# Patient Record
Sex: Male | Born: 1980 | Race: Black or African American | Hispanic: No | Marital: Single | Smoking: Current every day smoker
Health system: Southern US, Community
[De-identification: ages and names within clinical notes are randomized; demographics above are authoritative.]

## PROBLEM LIST (undated history)

## (undated) DIAGNOSIS — K4090 Unilateral inguinal hernia, without obstruction or gangrene, not specified as recurrent: Secondary | ICD-10-CM

## (undated) DIAGNOSIS — K635 Polyp of colon: Secondary | ICD-10-CM

---

## 1999-12-24 ENCOUNTER — Emergency Department (HOSPITAL_COMMUNITY): Admission: EM | Admit: 1999-12-24 | Discharge: 1999-12-24 | Payer: Self-pay | Admitting: Emergency Medicine

## 1999-12-25 ENCOUNTER — Emergency Department (HOSPITAL_COMMUNITY): Admission: EM | Admit: 1999-12-25 | Discharge: 1999-12-25 | Payer: Self-pay | Admitting: Internal Medicine

## 1999-12-29 ENCOUNTER — Encounter: Payer: Self-pay | Admitting: Emergency Medicine

## 1999-12-29 ENCOUNTER — Emergency Department (HOSPITAL_COMMUNITY): Admission: EM | Admit: 1999-12-29 | Discharge: 1999-12-29 | Payer: Self-pay | Admitting: *Deleted

## 1999-12-30 ENCOUNTER — Encounter: Payer: Self-pay | Admitting: Urology

## 1999-12-30 ENCOUNTER — Ambulatory Visit (HOSPITAL_COMMUNITY): Admission: RE | Admit: 1999-12-30 | Discharge: 1999-12-30 | Payer: Self-pay | Admitting: Urology

## 2004-08-17 ENCOUNTER — Emergency Department (HOSPITAL_COMMUNITY): Admission: EM | Admit: 2004-08-17 | Discharge: 2004-08-17 | Payer: Self-pay | Admitting: Family Medicine

## 2005-03-18 ENCOUNTER — Encounter: Admission: RE | Admit: 2005-03-18 | Discharge: 2005-03-18 | Payer: Self-pay | Admitting: Sports Medicine

## 2005-04-02 ENCOUNTER — Encounter: Admission: RE | Admit: 2005-04-02 | Discharge: 2005-04-02 | Payer: Self-pay | Admitting: Sports Medicine

## 2005-06-11 ENCOUNTER — Emergency Department (HOSPITAL_COMMUNITY): Admission: EM | Admit: 2005-06-11 | Discharge: 2005-06-12 | Payer: Self-pay | Admitting: Emergency Medicine

## 2006-09-14 ENCOUNTER — Emergency Department (HOSPITAL_COMMUNITY): Admission: EM | Admit: 2006-09-14 | Discharge: 2006-09-14 | Payer: Self-pay | Admitting: Emergency Medicine

## 2008-01-09 ENCOUNTER — Emergency Department (HOSPITAL_COMMUNITY): Admission: EM | Admit: 2008-01-09 | Discharge: 2008-01-09 | Payer: Self-pay | Admitting: Emergency Medicine

## 2008-12-12 ENCOUNTER — Emergency Department (HOSPITAL_COMMUNITY): Admission: EM | Admit: 2008-12-12 | Discharge: 2008-12-12 | Payer: Self-pay | Admitting: Emergency Medicine

## 2009-02-01 ENCOUNTER — Emergency Department (HOSPITAL_COMMUNITY): Admission: EM | Admit: 2009-02-01 | Discharge: 2009-02-01 | Payer: Self-pay | Admitting: Emergency Medicine

## 2009-04-03 ENCOUNTER — Emergency Department (HOSPITAL_COMMUNITY): Admission: EM | Admit: 2009-04-03 | Discharge: 2009-04-03 | Payer: Self-pay | Admitting: Emergency Medicine

## 2010-02-08 ENCOUNTER — Encounter: Payer: Self-pay | Admitting: Sports Medicine

## 2010-04-05 LAB — COMPREHENSIVE METABOLIC PANEL
ALT: 43 U/L (ref 0–53)
Albumin: 4.3 g/dL (ref 3.5–5.2)
BUN: 8 mg/dL (ref 6–23)
CO2: 27 mEq/L (ref 19–32)
Calcium: 9.7 mg/dL (ref 8.4–10.5)
Creatinine, Ser: 0.83 mg/dL (ref 0.4–1.5)
GFR calc non Af Amer: >60 mL/min (ref >60)
Glucose, Bld: 121 mg/dL — ABNORMAL HIGH (ref 70–99)
Sodium: 135 mEq/L (ref 135–145)
Total Bilirubin: 0.7 mg/dL (ref 0.3–1.2)

## 2010-10-22 LAB — URINALYSIS, ROUTINE W REFLEX MICROSCOPIC
Glucose, UA: NEGATIVE mg/dL
Leukocytes, UA: NEGATIVE
pH: 8 (ref 5.0–8.0)

## 2010-10-22 LAB — URINE CULTURE
Colony Count: NO GROWTH
Culture: NO GROWTH

## 2010-10-22 LAB — GLUCOSE, CAPILLARY: Glucose-Capillary: 125 mg/dL — ABNORMAL HIGH (ref 70–99)

## 2010-10-30 LAB — URINALYSIS, ROUTINE W REFLEX MICROSCOPIC
Glucose, UA: NEGATIVE
Nitrite: NEGATIVE
Protein, ur: NEGATIVE
pH: 6

## 2010-11-10 ENCOUNTER — Emergency Department (HOSPITAL_COMMUNITY)
Admission: EM | Admit: 2010-11-10 | Discharge: 2010-11-10 | Discharge status: Against Medical Advice | Payer: PRIVATE HEALTH INSURANCE | Attending: Emergency Medicine | Admitting: Emergency Medicine

## 2010-11-10 DIAGNOSIS — Z0389 Encounter for observation for other suspected diseases and conditions ruled out: Secondary | ICD-10-CM | POA: Insufficient documentation

## 2011-07-26 ENCOUNTER — Encounter (HOSPITAL_COMMUNITY): Payer: Self-pay | Admitting: Emergency Medicine

## 2011-07-26 ENCOUNTER — Emergency Department (HOSPITAL_COMMUNITY)
Admission: EM | Admit: 2011-07-26 | Discharge: 2011-07-26 | Disposition: A | Discharge status: Home / Self Care | Payer: Self-pay | Attending: Emergency Medicine | Admitting: Emergency Medicine

## 2011-07-26 DIAGNOSIS — R109 Unspecified abdominal pain: Secondary | ICD-10-CM

## 2011-07-26 DIAGNOSIS — R1013 Epigastric pain: Secondary | ICD-10-CM | POA: Insufficient documentation

## 2011-07-26 DIAGNOSIS — R112 Nausea with vomiting, unspecified: Secondary | ICD-10-CM | POA: Insufficient documentation

## 2011-07-26 HISTORY — DX: Polyp of colon: K63.5

## 2011-07-26 LAB — CBC
HCT: 43.6 % (ref 39.0–52.0)
Hemoglobin: 16 g/dL (ref 13.0–17.0)
MCH: 30.7 pg (ref 26.0–34.0)
MCHC: 36.7 g/dL — ABNORMAL HIGH (ref 30.0–36.0)
MCV: 83.5 fL (ref 78.0–100.0)
Platelets: 220 10*3/uL (ref 150–400)
RBC: 5.22 MIL/uL (ref 4.22–5.81)
RDW: 12.6 % (ref 11.5–15.5)
WBC: 11.7 10*3/uL — ABNORMAL HIGH (ref 4.0–10.5)

## 2011-07-26 LAB — COMPREHENSIVE METABOLIC PANEL
ALT: 55 U/L — ABNORMAL HIGH (ref 0–53)
AST: 38 U/L — ABNORMAL HIGH (ref 0–37)
Albumin: 4.1 g/dL (ref 3.5–5.2)
Alkaline Phosphatase: 63 U/L (ref 39–117)
BUN: 9 mg/dL (ref 6–23)
CO2: 23 mEq/L (ref 19–32)
Calcium: 9.9 mg/dL (ref 8.4–10.5)
Chloride: 98 mEq/L (ref 96–112)
Creatinine, Ser: 0.77 mg/dL (ref 0.50–1.35)
GFR calc Af Amer: >90 mL/min (ref >90)
GFR calc non Af Amer: >90 mL/min (ref >90)
Glucose, Bld: 126 mg/dL — ABNORMAL HIGH (ref 70–99)
Potassium: 3.7 mEq/L (ref 3.5–5.1)
Sodium: 133 mEq/L — ABNORMAL LOW (ref 135–145)
Total Bilirubin: 0.6 mg/dL (ref 0.3–1.2)
Total Protein: 7.8 g/dL (ref 6.0–8.3)

## 2011-07-26 LAB — URINALYSIS, ROUTINE W REFLEX MICROSCOPIC
Bilirubin Urine: NEGATIVE
Glucose, UA: NEGATIVE mg/dL
Hgb urine dipstick: NEGATIVE
Ketones, ur: NEGATIVE mg/dL
Leukocytes, UA: NEGATIVE
Nitrite: NEGATIVE
Protein, ur: 30 mg/dL — AB
Specific Gravity, Urine: 1.024 (ref 1.005–1.030)
Urobilinogen, UA: 0.2 mg/dL (ref 0.0–1.0)
pH: 8 (ref 5.0–8.0)

## 2011-07-26 LAB — URINE MICROSCOPIC-ADD ON

## 2011-07-26 LAB — LIPASE, BLOOD: Lipase: 19 U/L (ref 11–59)

## 2011-07-26 MED ORDER — ONDANSETRON HCL 4 MG/2ML IJ SOLN
4.0000 mg | Freq: Once | INTRAMUSCULAR | Status: AC
Start: 2011-07-26 — End: 2011-07-26
  Administered 2011-07-26: 4 mg via INTRAVENOUS
  Filled 2011-07-26: qty 2

## 2011-07-26 MED ORDER — SODIUM CHLORIDE 0.9 % IV BOLUS (SEPSIS)
1000.0000 mL | Freq: Once | INTRAVENOUS | Status: AC
  Administered 2011-07-26: 1000 mL via INTRAVENOUS

## 2011-07-26 MED ORDER — GI COCKTAIL ~~LOC~~
30.0000 mL | Freq: Once | ORAL | Status: AC
  Administered 2011-07-26: 30 mL via ORAL
  Filled 2011-07-26: qty 30

## 2011-07-26 MED ORDER — FAMOTIDINE 20 MG PO TABS: 20.0000 mg | ORAL_TABLET | Freq: Two times a day (BID) | ORAL | Status: AC

## 2011-07-26 MED ORDER — HYDROMORPHONE HCL PF 1 MG/ML IJ SOLN
1.0000 mg | Freq: Once | INTRAMUSCULAR | Status: AC
  Administered 2011-07-26: 1 mg via INTRAVENOUS
  Filled 2011-07-26: qty 1

## 2011-07-26 MED ORDER — LORAZEPAM 2 MG/ML IJ SOLN
1.5000 mg | Freq: Once | INTRAMUSCULAR | Status: AC
  Administered 2011-07-26: 1.5 mg via INTRAVENOUS
  Filled 2011-07-26: qty 1

## 2011-07-26 NOTE — ED Notes (Signed)
Pt c/o abd pain and vomiting since midnight. Pt reports "polyps on gallbladder".  States "they normally give me this pink stuff to drink and it makes all of the pain go away."

## 2011-07-26 NOTE — ED Notes (Signed)
md at bedside

## 2011-07-26 NOTE — ED Provider Notes (Signed)
History    31yM with abdominal pain. Onset around 0000 this morning. Epigastric. Gradual onset. Constant. No appreciable exacerbating or relieving factors. Just hurts. Does not radiate. Nausea and vomiting x2. NBNB. No fever or chills. No urinary complaints. Denies hx of abdominal surgery. Has had pain multiple times previous, but not in over 6 months. Previous evaluation including [place] which showed adenomyomatosis otherwise normal. Reports that he typically receives "that pink stuff to drink and the pain goes away."  CSN: [id number]  Arrival date & time 07/26/11  3536   First MD Initiated Contact with Patient 07/26/11 [phone]      Chief Complaint  Patient presents with  . Abdominal Pain  . Emesis    (Consider location/radiation/quality/duration/timing/severity/associated sxs/prior treatment) HPI  Past Medical History  Diagnosis Date  . Intestinal polyps     reports on gallbladder    History reviewed. No pertinent past surgical history.  History reviewed. No pertinent family history.  History  Substance Use Topics  . Smoking status: Not on file  . Smokeless tobacco: Not on file  . Alcohol Use: Yes     occasionally      Review of Systems   Review of symptoms negative unless otherwise noted in HPI.   Allergies  Penicillins  Home Medications   Current Outpatient Rx  Name Route Sig Dispense Refill  . HYDROCODONE-ACETAMINOPHEN 5-500 MG PO TABS Oral Take 0.5 tablets by mouth every 6 (six) hours as needed. For pain    . OXYCODONE-ACETAMINOPHEN 5-325 MG PO TABS Oral Take 0.5 tablets by mouth every 4 (four) hours as needed. For pain    . SIMETHICONE 125 MG PO CAPS Oral Take 2 capsules by mouth every 6 (six) hours as needed. For bloating      BP 123/63  Pulse 85  Temp 97.4 F (36.3 C) (Oral)  Resp 24  SpO2 100%  Physical Exam  Nursing note and vitals reviewed. Constitutional: He appears well-developed and well-nourished. No distress.  HENT:  Head: Normocephalic and  atraumatic.  Eyes: Conjunctivae are normal. Right eye exhibits no discharge. Left eye exhibits no discharge.  Neck: Neck supple.  Cardiovascular: Normal rate, regular rhythm and normal heart sounds.  Exam reveals no gallop and no friction rub.   No murmur heard. Pulmonary/Chest: Effort normal and breath sounds normal. No respiratory distress.  Abdominal: Soft. He exhibits no distension. There is tenderness. There is no rebound and no guarding.       Tenderness in epigastrium without rebound or guarding.  Genitourinary:       No cva tenderness  Musculoskeletal: He exhibits no edema and no tenderness.  Neurological: He is alert.  Skin: Skin is warm and dry.  Psychiatric: He has a normal mood and affect. His behavior is normal. Thought content normal.    ED Course  Procedures (including critical care time)  Labs Reviewed  URINALYSIS, ROUTINE W REFLEX MICROSCOPIC - Abnormal; Notable for the following:    APPearance CLOUDY (*)     Protein, ur 30 (*)     All other components within normal limits  COMPREHENSIVE METABOLIC PANEL - Abnormal; Notable for the following:    Sodium 133 (*)     Glucose, Bld 126 (*)     AST 38 (*)     ALT 55 (*)     All other components within normal limits  CBC - Abnormal; Notable for the following:    WBC 11.7 (*)     MCHC 36.7 (*)  All other components within normal limits  URINE MICROSCOPIC-ADD ON - Abnormal; Notable for the following:    Bacteria, UA MANY (*)     All other components within normal limits  LIPASE, BLOOD   No results found.   1. Abdominal pain       MDM  31yM with abdominal pain. Mild tenderness on exam. Low suspicion for acute surgical abdomen and imaging deferred. Afebrile and well appearing. Pain much improved with meds. Repeat exam prior to DC reassuring.  Return precautions discussed. Plan trial of h2 blocker for possible. PUD. GI fu.        Raeford Razor, MD 07/26/11 1039

## 2011-07-26 NOTE — ED Notes (Signed)
Pt alert and oriented x4. Respirations even and unlabored, bilateral symmetrical rise and fall of chest. Skin warm and dry. In no acute distress. Denies needs.   

## 2011-07-26 NOTE — ED Notes (Signed)
Pt alert and oriented x4. Respirations even and unlabored, bilateral symmetrical rise and fall of chest. Skin warm and dry. In no acute distress. Denies needs.   Pt offered a wheelchair to discharge window. Pt denied.

## 2018-02-26 ENCOUNTER — Other Ambulatory Visit: Payer: Self-pay

## 2018-02-26 ENCOUNTER — Encounter (HOSPITAL_COMMUNITY): Payer: Self-pay

## 2018-02-26 ENCOUNTER — Emergency Department (HOSPITAL_COMMUNITY)
Admission: EM | Admit: 2018-02-26 | Discharge: 2018-02-26 | Disposition: A | Discharge status: Home / Self Care | Payer: PRIVATE HEALTH INSURANCE | Attending: Emergency Medicine | Admitting: Emergency Medicine

## 2018-02-26 ENCOUNTER — Emergency Department (HOSPITAL_COMMUNITY): Payer: PRIVATE HEALTH INSURANCE

## 2018-02-26 DIAGNOSIS — J101 Influenza due to other identified influenza virus with other respiratory manifestations: Secondary | ICD-10-CM | POA: Insufficient documentation

## 2018-02-26 LAB — BASIC METABOLIC PANEL
ANION GAP: 12 (ref 5–15)
BUN: 7 mg/dL (ref 6–20)
CHLORIDE: 99 mmol/L (ref 98–111)
CO2: 24 mmol/L (ref 22–32)
Calcium: 9.1 mg/dL (ref 8.9–10.3)
Creatinine, Ser: 0.89 mg/dL (ref 0.61–1.24)
GFR calc Af Amer: >60 mL/min (ref >60)
GFR calc non Af Amer: >60 mL/min (ref >60)
GLUCOSE: 104 mg/dL — AB (ref 70–99)
POTASSIUM: 3.6 mmol/L (ref 3.5–5.1)
Sodium: 135 mmol/L (ref 135–145)

## 2018-02-26 LAB — CBC WITH DIFFERENTIAL/PLATELET
ABS IMMATURE GRANULOCYTES: 0.02 10*3/uL (ref 0.00–0.07)
BASOS PCT: 0 %
Basophils Absolute: 0 10*3/uL (ref 0.0–0.1)
EOS ABS: 0 10*3/uL (ref 0.0–0.5)
Eosinophils Relative: 0 %
HCT: 46.2 % (ref 39.0–52.0)
Hemoglobin: 15.3 g/dL (ref 13.0–17.0)
IMMATURE GRANULOCYTES: 0 %
LYMPHS ABS: 0.5 10*3/uL — AB (ref 0.7–4.0)
Lymphocytes Relative: 8 %
MCH: 29.1 pg (ref 26.0–34.0)
MCHC: 33.1 g/dL (ref 30.0–36.0)
MCV: 87.8 fL (ref 80.0–100.0)
MONO ABS: 0.6 10*3/uL (ref 0.1–1.0)
MONOS PCT: 10 %
NEUTROS ABS: 4.7 10*3/uL (ref 1.7–7.7)
NEUTROS PCT: 82 %
NRBC: 0 % (ref 0.0–0.2)
Platelets: 172 10*3/uL (ref 150–400)
RBC: 5.26 MIL/uL (ref 4.22–5.81)
RDW: 12.6 % (ref 11.5–15.5)
WBC: 5.8 10*3/uL (ref 4.0–10.5)

## 2018-02-26 LAB — INFLUENZA PANEL BY PCR (TYPE A & B)
INFLAPCR: POSITIVE — AB
INFLBPCR: NEGATIVE

## 2018-02-26 MED ORDER — KETOROLAC TROMETHAMINE 30 MG/ML IJ SOLN
30.0000 mg | Freq: Once | INTRAMUSCULAR | Status: AC
  Administered 2018-02-26: 30 mg via INTRAVENOUS
  Filled 2018-02-26: qty 1

## 2018-02-26 MED ORDER — ONDANSETRON HCL 4 MG/2ML IJ SOLN
4.0000 mg | Freq: Once | INTRAMUSCULAR | Status: AC
  Administered 2018-02-26: 4 mg via INTRAVENOUS
  Filled 2018-02-26: qty 2

## 2018-02-26 MED ORDER — OSELTAMIVIR PHOSPHATE 75 MG PO CAPS: 75.0000 mg | ORAL_CAPSULE | Freq: Two times a day (BID) | ORAL | 0 refills | Status: AC

## 2018-02-26 MED ORDER — IPRATROPIUM-ALBUTEROL 0.5-2.5 (3) MG/3ML IN SOLN
3.0000 mL | Freq: Once | RESPIRATORY_TRACT | Status: AC
Start: 2018-02-26 — End: 2018-02-26
  Administered 2018-02-26: 3 mL via RESPIRATORY_TRACT
  Filled 2018-02-26: qty 3

## 2018-02-26 MED ORDER — ALBUTEROL SULFATE HFA 108 (90 BASE) MCG/ACT IN AERS
2.0000 | INHALATION_SPRAY | RESPIRATORY_TRACT | Status: DC | PRN
  Administered 2018-02-26: 2 via RESPIRATORY_TRACT
  Filled 2018-02-26: qty 6.7

## 2018-02-26 MED ORDER — IBUPROFEN 800 MG PO TABS: 800.0000 mg | ORAL_TABLET | Freq: Three times a day (TID) | ORAL | 0 refills | Status: AC | PRN

## 2018-02-26 MED ORDER — ONDANSETRON 4 MG PO TBDP: 4.0000 mg | ORAL_TABLET | Freq: Three times a day (TID) | ORAL | 0 refills | Status: AC | PRN

## 2018-02-26 MED ORDER — METHYLPREDNISOLONE SODIUM SUCC 125 MG IJ SOLR
125.0000 mg | Freq: Once | INTRAMUSCULAR | Status: AC
  Administered 2018-02-26: 125 mg via INTRAVENOUS
  Filled 2018-02-26: qty 2

## 2018-02-26 MED ORDER — IPRATROPIUM-ALBUTEROL 0.5-2.5 (3) MG/3ML IN SOLN
3.0000 mL | Freq: Once | RESPIRATORY_TRACT | Status: AC
  Administered 2018-02-26: 3 mL via RESPIRATORY_TRACT
  Filled 2018-02-26: qty 3

## 2018-02-26 NOTE — ED Notes (Signed)
Reviewed d/c instructions with pt, who verbalized understanding and had no outstanding questions. Pt departed in NAD, refused use of wheelchair.   

## 2018-02-26 NOTE — ED Notes (Signed)
Pt placed on 2L [place] with oxygen saturation of 94%, 90% on room air

## 2018-02-26 NOTE — ED Notes (Signed)
Pt ambulated in hallway on pulse ox with steady gait. O2 saturation stayed between 90-95% and went down to 89% when pt got back to room. No complaints from the pt while ambulating and he did state that he was "breathing so much better than when he first came in."

## 2018-02-26 NOTE — ED Triage Notes (Addendum)
Pt reports flu like symptoms since this morning (gen body aches, cough, chills, no appetite) pt taking tylenol OTC. Pt also reports he is feeling short of breath

## 2018-02-26 NOTE — ED Provider Notes (Signed)
[facility] Provider Note   CSN: [id number] Arrival date & time: 02/26/18  1958     History   Chief Complaint Chief Complaint  Patient presents with  . Generalized Body Aches  . Chills  . Cough  . Shortness of Breath    HPI Amias D Ladona Ridgelaylor is a 38 y.o. male.  The history is provided by the patient and medical records. No language interpreter was used.  Cough  Associated symptoms: chills, myalgias and shortness of breath   Associated symptoms: no chest pain, no fever and no sore throat   Shortness of Breath  Associated symptoms: cough   Associated symptoms: no chest pain, no fever and no sore throat    Allon D Ladona Ridgelaylor is a 38 y.o. male  With no known PMH who presents to the Emergency Department complaining of shortness of breath which began today. Associated with generalized body aches, cough, chills, decreased appetite and nausea. He tried to smoke a cigarette and immediately threw up. He has had no further emesis outside of this. No diarrhea. No known fever. His child had similar symptoms a few days ago, but now feels improved. He is a smoker. Denies any known lung problems, never had to use an inhaler. Tried to take some Tylenol prior to arrival, but believes he threw it up. No other meds taken.   Past Medical History:  Diagnosis Date  . Intestinal polyps    reports on gallbladder    There are no active problems to display for this patient.   History reviewed. No pertinent surgical history.      Home Medications    Prior to Admission medications   Medication Sig Start Date End Date Taking? Authorizing Provider  famotidine (PEPCID) 20 MG tablet Take 1 tablet (20 mg total) by mouth 2 (two) times daily. 07/26/11 07/25/12  Raeford RazorKohut, Stephen, MD  HYDROcodone-acetaminophen (VICODIN) 5-500 MG per tablet Take 0.5 tablets by mouth every 6 (six) hours as needed. For pain    [provider]  ibuprofen (ADVIL,MOTRIN) 800 MG tablet Take 1  tablet (800 mg total) by mouth every 8 (eight) hours as needed. 02/26/18   Ary Rudnick, Chase PicketJaime Pilcher, PA-C  ondansetron (ZOFRAN ODT) 4 MG disintegrating tablet Take 1 tablet (4 mg total) by mouth every 8 (eight) hours as needed for nausea or vomiting. 02/26/18   Nathalee Smarr, Chase PicketJaime Pilcher, PA-C  oseltamivir (TAMIFLU) 75 MG capsule Take 1 capsule (75 mg total) by mouth every 12 (twelve) hours. 02/26/18   Biannca Scantlin, Chase PicketJaime Pilcher, PA-C  oxyCODONE-acetaminophen (PERCOCET) 5-325 MG per tablet Take 0.5 tablets by mouth every 4 (four) hours as needed. For pain    [provider]  Simethicone (GAS FREE EXTRA STRENGTH) 125 MG CAPS Take 2 capsules by mouth every 6 (six) hours as needed. For bloating    [provider]    Family History No family history on file.  Social History Social History   Tobacco Use  . Smoking status: Not on file  Substance Use Topics  . Alcohol use: Yes    Comment: occasionally  . Drug use: Not on file     Allergies   Penicillins   Review of Systems Review of Systems  Constitutional: Positive for appetite change, chills and fatigue. Negative for fever.  HENT: Positive for congestion. Negative for sore throat.   Respiratory: Positive for cough and shortness of breath.   Cardiovascular: Negative for chest pain.  Musculoskeletal: Positive for myalgias.  All other systems reviewed  and are negative.    Physical Exam Updated Vital Signs BP 115/69 (BP Location: Left Arm)   Pulse 88   Temp 99.3 F (37.4 C) (Oral)   Resp 16   SpO2 90%   Physical Exam Vitals signs and nursing note reviewed.  Constitutional:      General: He is not in acute distress.    Appearance: He is well-developed.  HENT:     Head: Normocephalic and atraumatic.  Neck:     Musculoskeletal: Neck supple.  Cardiovascular:     Heart sounds: Normal heart sounds. No murmur.     Comments: Mildly tachycardic, but regular. Pulmonary:     Effort: Pulmonary effort is normal. No respiratory distress.      Comments: Inspiratory wheezing bilaterally. Abdominal:     General: There is no distension.     Palpations: Abdomen is soft.     Tenderness: There is no abdominal tenderness.  Skin:    General: Skin is warm and dry.  Neurological:     Mental Status: He is alert and oriented to person, place, and time.      ED Treatments / Results  Labs (all labs ordered are listed, but only abnormal results are displayed) Labs Reviewed  INFLUENZA PANEL BY PCR (TYPE A & B) - Abnormal; Notable for the following components:      Result Value   Influenza A By PCR POSITIVE (*)    All other components within normal limits  CBC WITH DIFFERENTIAL/PLATELET - Abnormal; Notable for the following components:   Lymphs Abs 0.5 (*)    All other components within normal limits  BASIC METABOLIC PANEL - Abnormal; Notable for the following components:   Glucose, Bld 104 (*)    All other components within normal limits    EKG EKG Interpretation  Date/Time:  Sunday February 26 2018 20:20:17 EST Ventricular Rate:  104 PR Interval:  140 QRS Duration: 82 QT Interval:  324 QTC Calculation: 426 R Axis:   103 Text Interpretation:  Sinus tachycardia Rightward axis Borderline ECG Confirmed by Gerhard MunchLockwood, Robert [phone]([id number]) on 02/26/2018 8:48:36 PM   Radiology Dg Chest 2 View  Result Date: 02/26/2018 CLINICAL DATA:  Flu-like symptoms for 1 day, initial encounter EXAM: CHEST - 2 VIEW COMPARISON:  None. FINDINGS: The heart size and mediastinal contours are within normal limits. Both lungs are clear. The visualized skeletal structures are unremarkable. IMPRESSION: No active cardiopulmonary disease. Electronically Signed   By: Alcide CleverMark  Lukens M.D.   On: 02/26/2018 21:07    Procedures Procedures (including critical care time)  Medications Ordered in ED Medications  albuterol (PROVENTIL HFA;VENTOLIN HFA) 108 (90 Base) MCG/ACT inhaler 2 puff (2 puffs Inhalation Given 02/26/18 2251)  ipratropium-albuterol (DUONEB) 0.5-2.5 (3)  MG/3ML nebulizer solution 3 mL (3 mLs Nebulization Given 02/26/18 2024)  ketorolac (TORADOL) 30 MG/ML injection 30 mg (30 mg Intravenous Given 02/26/18 2029)  methylPREDNISolone sodium succinate (SOLU-MEDROL) 125 mg/2 mL injection 125 mg (125 mg Intravenous Given 02/26/18 2030)  ondansetron (ZOFRAN) injection 4 mg (4 mg Intravenous Given 02/26/18 2101)  ipratropium-albuterol (DUONEB) 0.5-2.5 (3) MG/3ML nebulizer solution 3 mL (3 mLs Nebulization Given 02/26/18 2150)     Initial Impression / Assessment and Plan / ED Course  I have reviewed the triage vital signs and the nursing notes.  Pertinent labs & imaging results that were available during my care of the patient were reviewed by me and considered in my medical decision making (see chart for details).    Boyd KerbsChike D Fesperman is  a 38 y.o. male who presents to ED for shortness of breath which began this morning associated with generalized body aches cough.  On initial examination, oxygenation saturation right around 89 to 90% on room air.  He was placed on 2 L.  Given neb treatment.  On reevaluation, he endorses that his symptoms are much improved.  He no longer feels short of breath.  Chest x-ray without acute findings.  He did test positive for flu A.  On another reevaluation, his lungs are clear to auscultation bilaterally.  He is speaking in full sentences without any difficulty.  He is oxygenating right around 90% still.  He ambulated and did not feel short of breath whatsoever.  He did drop to 88%, but again felt asymptomatic.  He would very much like to go home. He tells me he is breathing fine and does not see why he would need to stay in the hospital. We discussed that he should have very low threshold to return should his breathing worsen.  Will give prescription for Tamiflu along with ibuprofen and Zofran for symptom control.  He was provided with an albuterol inhaler here in the emergency department and instructed on how to use this.  Strict return  precautions again discussed and all questions answered.  Patient discharged from hospital in satisfactory condition.   Patient discussed with Dr. Jeraldine Loots who agrees with treatment plan.     Final Clinical Impressions(s) / ED Diagnoses   Final diagnoses:  Influenza A    ED Discharge Orders         Ordered    oseltamivir (TAMIFLU) 75 MG capsule  Every 12 hours     02/26/18 2257    ibuprofen (ADVIL,MOTRIN) 800 MG tablet  Every 8 hours PRN     02/26/18 2257    ondansetron (ZOFRAN ODT) 4 MG disintegrating tablet  Every 8 hours PRN     02/26/18 2257           Joshuah Minella, Chase Picket, PA-C 02/26/18 2305    Gerhard Munch, MD 02/26/18 2352

## 2018-02-26 NOTE — Discharge Instructions (Addendum)
It was my pleasure taking care of you today!  Use your inhaler (2 puffs) every 4-6 hours as needed for shortness of breath.  Zofran as needed for nausea.  Alternate between Tylenol and ibuprofen as needed for body aches / fevers. Rest, drink plenty of fluids to stay hydrated. Wash your hands often.  Follow up with your doctor in regards to your hospital visit. As we discussed, you should return to the ER if you begin having difficulty breathing. Return to the emergency department if symptoms worsen, become progressive, or become more concerning.

## 2018-02-26 NOTE — ED Notes (Signed)
Patient transported to [facility] 

## 2019-06-13 IMAGING — DX DG CHEST 2V
2 series · 2 of 2 positions shown · non-contrast
Comparison: None.

CLINICAL DATA: Flu-like symptoms for 1 day, initial encounter

EXAM:
CHEST - 2 VIEW

[w chest pa]
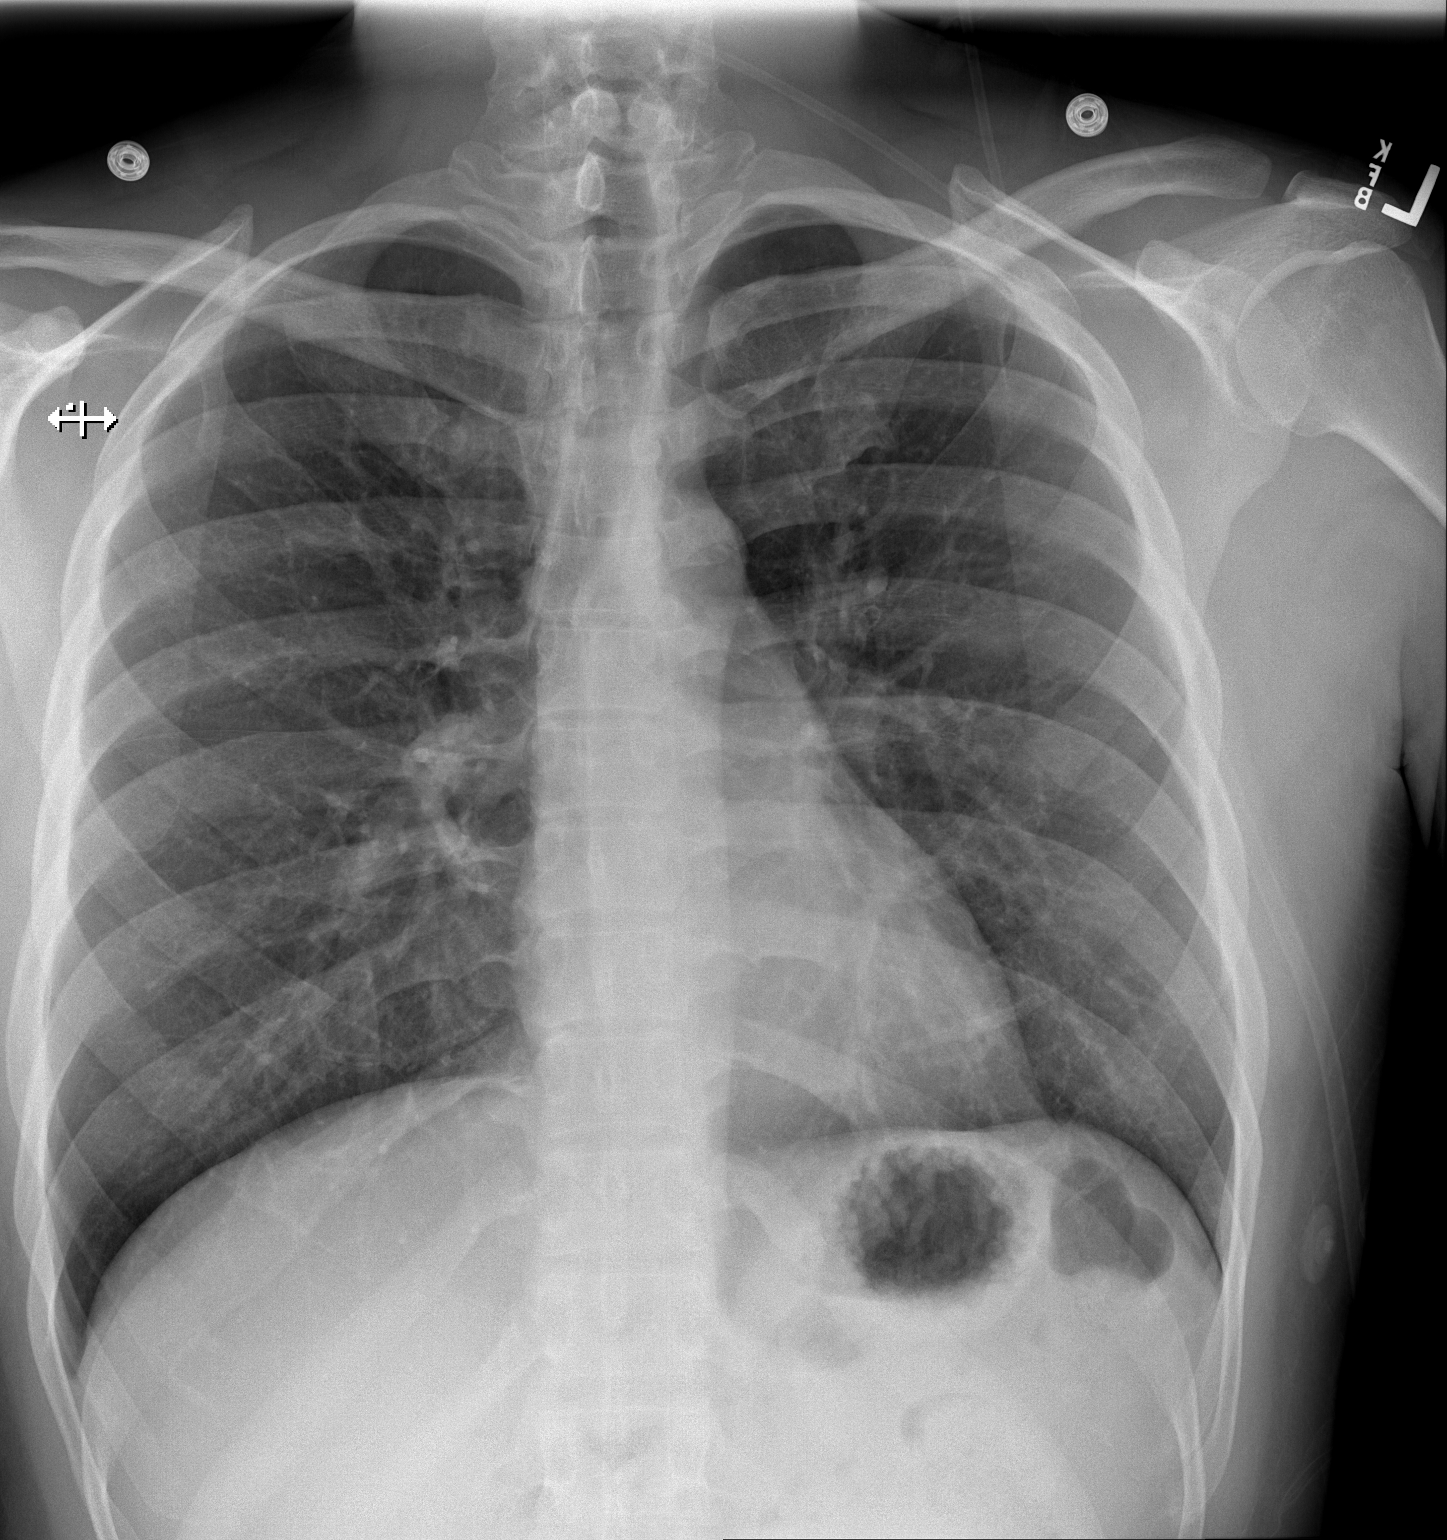

[w chest lat]
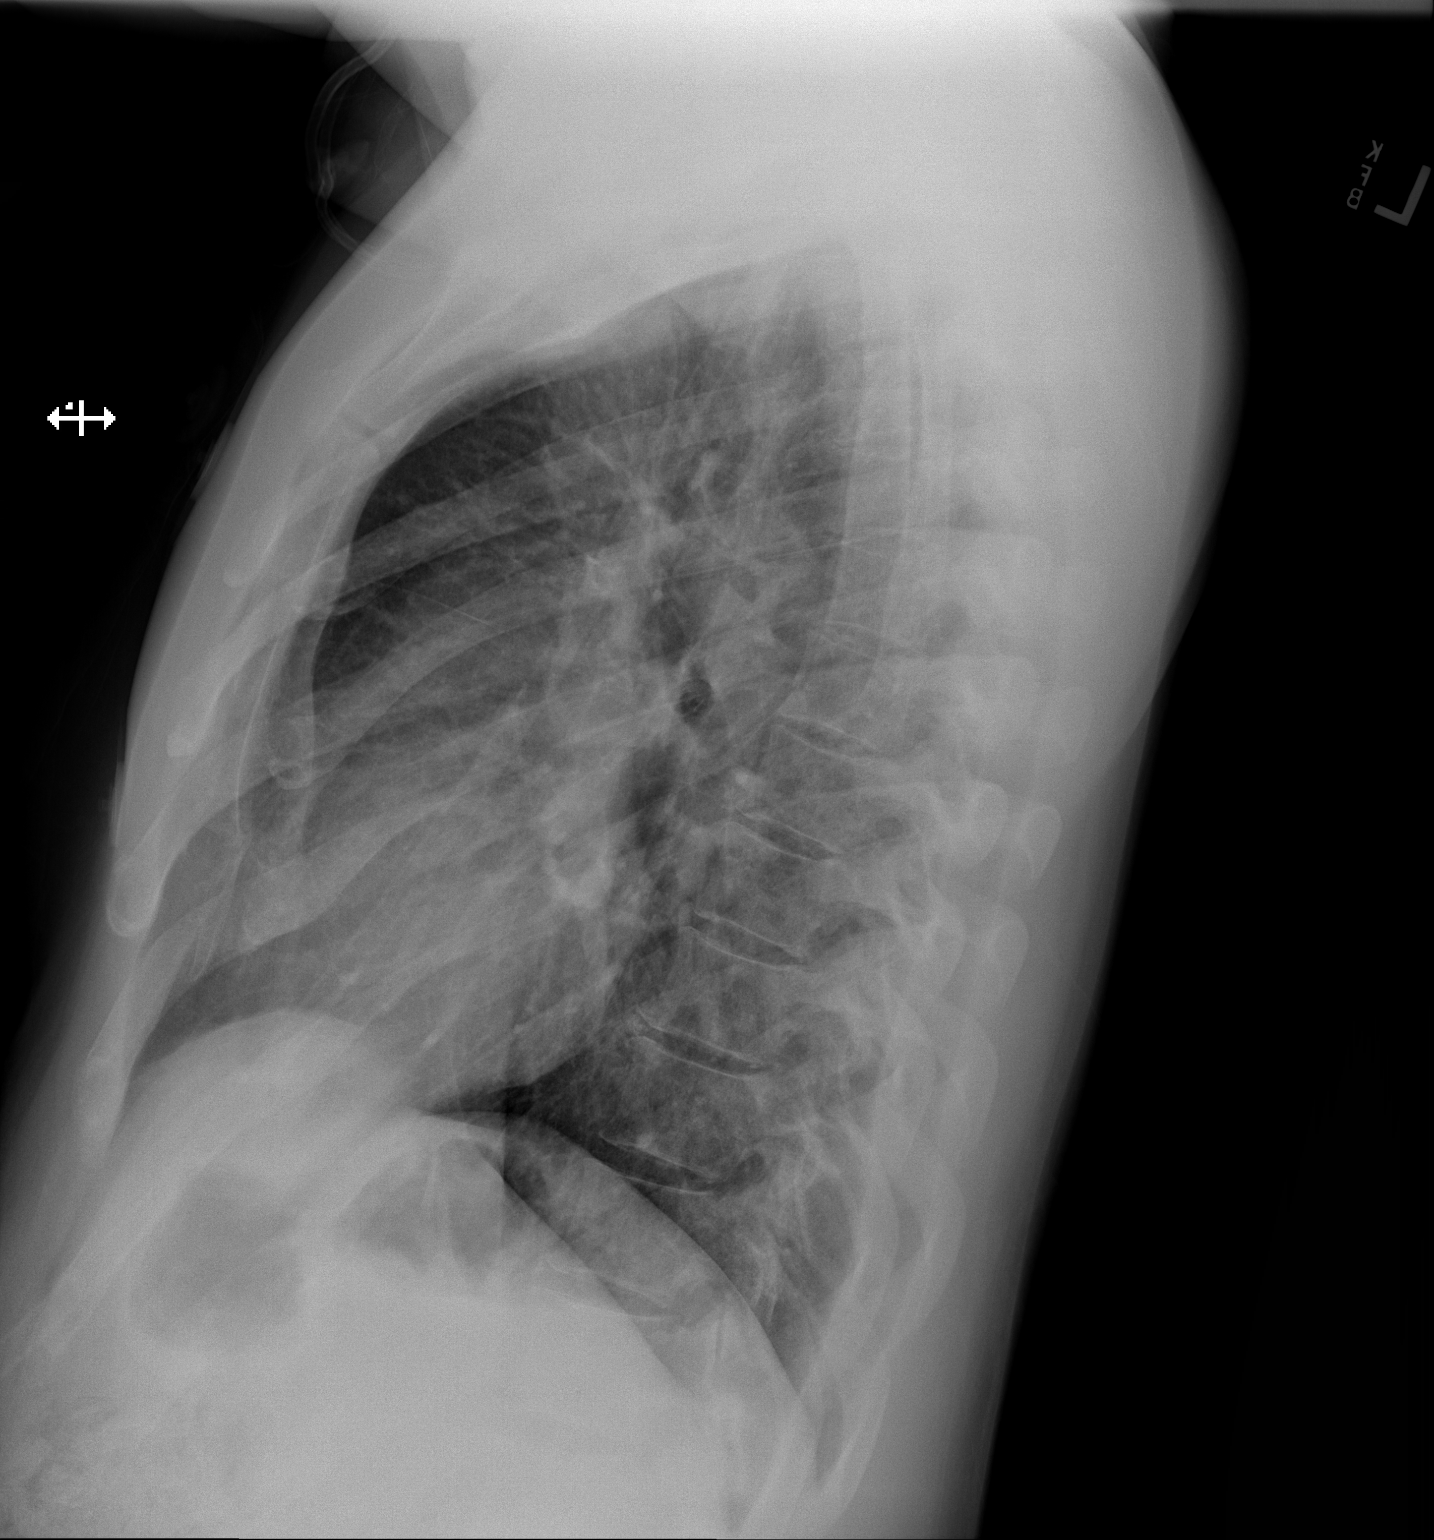

[2 of 2 positions shown; findings below may reference images not displayed]

FINDINGS: The heart size and mediastinal contours are within normal limits.
Both lungs are clear. The visualized skeletal structures are
unremarkable.
IMPRESSION: No active cardiopulmonary disease.

## 2019-09-13 ENCOUNTER — Other Ambulatory Visit: Payer: Self-pay

## 2019-09-13 ENCOUNTER — Encounter (HOSPITAL_COMMUNITY): Payer: Self-pay | Admitting: *Deleted

## 2019-09-13 DIAGNOSIS — K4090 Unilateral inguinal hernia, without obstruction or gangrene, not specified as recurrent: Secondary | ICD-10-CM | POA: Insufficient documentation

## 2019-09-13 DIAGNOSIS — Z79899 Other long term (current) drug therapy: Secondary | ICD-10-CM | POA: Insufficient documentation

## 2019-09-13 LAB — COMPREHENSIVE METABOLIC PANEL
ALT: 30 U/L (ref 0–44)
AST: 21 U/L (ref 15–41)
Albumin: 4.5 g/dL (ref 3.5–5.0)
Alkaline Phosphatase: 55 U/L (ref 38–126)
Anion gap: 10 (ref 5–15)
BUN: 10 mg/dL (ref 6–20)
CO2: 26 mmol/L (ref 22–32)
Calcium: 9.7 mg/dL (ref 8.9–10.3)
Chloride: 106 mmol/L (ref 98–111)
Creatinine, Ser: 0.97 mg/dL (ref 0.61–1.24)
GFR calc Af Amer: >60 mL/min (ref >60)
GFR calc non Af Amer: >60 mL/min (ref >60)
Glucose, Bld: 87 mg/dL (ref 70–99)
Potassium: 3.7 mmol/L (ref 3.5–5.1)
Sodium: 142 mmol/L (ref 135–145)
Total Bilirubin: 0.5 mg/dL (ref 0.3–1.2)
Total Protein: 7.8 g/dL (ref 6.5–8.1)

## 2019-09-13 LAB — URINALYSIS, ROUTINE W REFLEX MICROSCOPIC
Bilirubin Urine: NEGATIVE
Glucose, UA: NEGATIVE mg/dL
Hgb urine dipstick: NEGATIVE
Ketones, ur: NEGATIVE mg/dL
Leukocytes,Ua: NEGATIVE
Nitrite: NEGATIVE
Protein, ur: NEGATIVE mg/dL
Specific Gravity, Urine: 1.028 (ref 1.005–1.030)
pH: 5 (ref 5.0–8.0)

## 2019-09-13 LAB — CBC
HCT: 45.1 % (ref 39.0–52.0)
Hemoglobin: 15.7 g/dL (ref 13.0–17.0)
MCH: 30.5 pg (ref 26.0–34.0)
MCHC: 34.8 g/dL (ref 30.0–36.0)
MCV: 87.6 fL (ref 80.0–100.0)
Platelets: 217 10*3/uL (ref 150–400)
RBC: 5.15 MIL/uL (ref 4.22–5.81)
RDW: 12.5 % (ref 11.5–15.5)
WBC: 6.2 10*3/uL (ref 4.0–10.5)
nRBC: 0 % (ref 0.0–0.2)

## 2019-09-13 LAB — LIPASE, BLOOD: Lipase: 24 U/L (ref 11–51)

## 2019-09-13 NOTE — ED Triage Notes (Signed)
Pt states left groin pain intermittent for several weeks. Pt states he has increased gas at times and feels like it is in his lower abdomen, and this results in increased discomfort in his left groin, swelling usally occurs during this time. Denies issues with constipation/diarrhea.

## 2019-09-13 NOTE — ED Notes (Signed)
Pt went outside 

## 2019-09-14 ENCOUNTER — Emergency Department (HOSPITAL_COMMUNITY)
Admission: EM | Admit: 2019-09-14 | Discharge: 2019-09-14 | Disposition: A | Discharge status: Home / Self Care | Payer: Self-pay | Attending: Emergency Medicine | Admitting: Emergency Medicine

## 2019-09-14 DIAGNOSIS — K4090 Unilateral inguinal hernia, without obstruction or gangrene, not specified as recurrent: Secondary | ICD-10-CM

## 2019-09-14 NOTE — ED Provider Notes (Addendum)
[facility] Note   CSN: [id number] Arrival date & time: 09/13/19  1938     History Chief Complaint  Patient presents with  . Abdominal Pain    Paul Cobb is a 39 y.o. male.  Patient presents to the emergency department with a chief complaint of abdominal pain.  He reports having intermittent episodes of a mass or bulge from his left lower abdomen.  States that it primarily happens when he feels gassy.  He states that after he passes gas, and the bulge will go down.  He has never seen a turn red, become extremely painful, or get hard.  He denies any fevers or chills.  Denies any constipation.  He is concerned about hernia.  The history is provided by the patient. No language interpreter was used.       Past Medical History:  Diagnosis Date  . Intestinal polyps    reports on gallbladder    There are no problems to display for this patient.   History reviewed. No pertinent surgical history.     No family history on file.  Social History   Tobacco Use  . Smoking status: Not on file  Substance Use Topics  . Alcohol use: Yes    Comment: occasionally  . Drug use: Not on file    Home Medications Prior to Admission medications   Medication Sig Start Date End Date Taking? Authorizing Provider  famotidine (PEPCID) 20 MG tablet Take 1 tablet (20 mg total) by mouth 2 (two) times daily. 07/26/11 07/25/12  Raeford Razor, MD  HYDROcodone-acetaminophen (VICODIN) 5-500 MG per tablet Take 0.5 tablets by mouth every 6 (six) hours as needed. For pain    [provider]  ibuprofen (ADVIL,MOTRIN) 800 MG tablet Take 1 tablet (800 mg total) by mouth every 8 (eight) hours as needed. 02/26/18   Ward, Chase Picket, PA-C  ondansetron (ZOFRAN ODT) 4 MG disintegrating tablet Take 1 tablet (4 mg total) by mouth every 8 (eight) hours as needed for nausea or vomiting. 02/26/18   Ward, Chase Picket, PA-C  oseltamivir (TAMIFLU) 75 MG capsule Take  1 capsule (75 mg total) by mouth every 12 (twelve) hours. 02/26/18   Ward, Chase Picket, PA-C  oxyCODONE-acetaminophen (PERCOCET) 5-325 MG per tablet Take 0.5 tablets by mouth every 4 (four) hours as needed. For pain    [provider]  Simethicone (GAS FREE EXTRA STRENGTH) 125 MG CAPS Take 2 capsules by mouth every 6 (six) hours as needed. For bloating    [provider]    Allergies    Penicillins  Review of Systems   Review of Systems  All other systems reviewed and are negative.   Physical Exam Updated Vital Signs BP (!) 162/113   Pulse 65   Temp 98.1 F (36.7 C) (Oral)   Resp 18   Ht 5\' 11"  (1.803 m)   Wt 99.8 kg   SpO2 98%   BMI 30.68 kg/m   Physical Exam Vitals and nursing note reviewed.  Constitutional:      Appearance: He is well-developed.  HENT:     Head: Normocephalic and atraumatic.  Eyes:     Conjunctiva/sclera: Conjunctivae normal.  Cardiovascular:     Rate and Rhythm: Normal rate and regular rhythm.     Heart sounds: No murmur heard.   Pulmonary:     Effort: Pulmonary effort is normal. No respiratory distress.     Breath sounds: Normal breath sounds.  Abdominal:  Palpations: Abdomen is soft.     Tenderness: There is no abdominal tenderness.     Comments: No palpable hernia during my exam  Musculoskeletal:        General: Normal range of motion.     Cervical back: Neck supple.  Skin:    General: Skin is warm and dry.  Neurological:     Mental Status: He is alert and oriented to person, place, and time.  Psychiatric:        Mood and Affect: Mood normal.        Behavior: Behavior normal.     ED Results / Procedures / Treatments   Labs (all labs ordered are listed, but only abnormal results are displayed) Labs Reviewed  LIPASE, BLOOD  COMPREHENSIVE METABOLIC PANEL  CBC  URINALYSIS, ROUTINE W REFLEX MICROSCOPIC    EKG None  Radiology No results found.  Procedures Procedures (including critical care  time)  Medications Ordered in ED Medications - No data to display  ED Course  I have reviewed the triage vital signs and the nursing notes.  Pertinent labs & imaging results that were available during my care of the patient were reviewed by me and considered in my medical decision making (see chart for details).    MDM Rules/Calculators/A&P                          Patient with symptoms that seem consistent with inguinal hernia.  No evidence of strangulated or incarcerated hernia on my exam.  No palpable mass on my exam.  No tenderness.  Labs and vitals are reassuring.  Denies any pain or swelling into his testicles.  Given instructions to follow-up with general surgery.  Recommend returning to ED if symptoms worsen.   Final Clinical Impression(s) / ED Diagnoses Final diagnoses:  Unilateral inguinal hernia without obstruction or gangrene, recurrence not specified    Rx / DC Orders ED Discharge Orders    None       Roxy Horseman, PA-C 09/14/19 0612    Roxy Horseman, PA-C 09/14/19 5400    Zadie Rhine, MD 09/14/19 0630

## 2019-10-23 ENCOUNTER — Ambulatory Visit
Admission: RE | Admit: 2019-10-23 | Discharge: 2019-10-23 | Discharge status: Against Medical Advice | Payer: Self-pay | Source: Ambulatory Visit

## 2019-10-23 ENCOUNTER — Ambulatory Visit
Admission: EM | Admit: 2019-10-23 | Discharge: 2019-10-23 | Disposition: A | Discharge status: Home / Self Care | Payer: Self-pay

## 2019-10-23 ENCOUNTER — Other Ambulatory Visit: Payer: Self-pay

## 2019-10-23 DIAGNOSIS — R030 Elevated blood-pressure reading, without diagnosis of hypertension: Secondary | ICD-10-CM

## 2019-10-23 HISTORY — DX: Unilateral inguinal hernia, without obstruction or gangrene, not specified as recurrent: K40.90

## 2019-10-23 NOTE — ED Provider Notes (Signed)
[facility]    CSN: [id number] Arrival date & time: 10/23/19  1808      History   Chief Complaint Chief Complaint  Patient presents with  . Appointment    6  . Hypertension    HPI Paul Cobb is a 39 y.o. male  Presenting for concern of elevated blood pressure readings at home.  Has a wrist cuff that has been having readings around 143/109.  Denies chest pain, palpitations, severe abdominal pain, change in urination or vision, lower leg swelling.  Does smoke, does not take antihypertensives.  Past Medical History:  Diagnosis Date  . Hernia, inguinal, left   . Intestinal polyps    reports on gallbladder    There are no problems to display for this patient.   History reviewed. No pertinent surgical history.     Home Medications    Prior to Admission medications   Medication Sig Start Date End Date Taking? Authorizing Provider  famotidine (PEPCID) 20 MG tablet Take 1 tablet (20 mg total) by mouth 2 (two) times daily. 07/26/11 07/25/12  Raeford Razor, MD  HYDROcodone-acetaminophen (VICODIN) 5-500 MG per tablet Take 0.5 tablets by mouth every 6 (six) hours as needed. For pain    [provider]  ibuprofen (ADVIL,MOTRIN) 800 MG tablet Take 1 tablet (800 mg total) by mouth every 8 (eight) hours as needed. 02/26/18   Ward, Chase Picket, PA-C  ondansetron (ZOFRAN ODT) 4 MG disintegrating tablet Take 1 tablet (4 mg total) by mouth every 8 (eight) hours as needed for nausea or vomiting. 02/26/18   Ward, Chase Picket, PA-C  oseltamivir (TAMIFLU) 75 MG capsule Take 1 capsule (75 mg total) by mouth every 12 (twelve) hours. 02/26/18   Ward, Chase Picket, PA-C  oxyCODONE-acetaminophen (PERCOCET) 5-325 MG per tablet Take 0.5 tablets by mouth every 4 (four) hours as needed. For pain    [provider]  Simethicone (GAS FREE EXTRA STRENGTH) 125 MG CAPS Take 2 capsules by mouth every 6 (six) hours as needed. For bloating    [provider]     Family History Family History  Problem Relation Age of Onset  . Thyroid disease Mother   . Hypertension Father   . Diabetes Father     Social History Social History   Tobacco Use  . Smoking status: Current Every Day Smoker    Packs/day: 1.50    Years: 15.00    Pack years: 22.50  . Smokeless tobacco: Never Used  Substance Use Topics  . Alcohol use: Yes    Comment: occasionally  . Drug use: Not on file     Allergies   Penicillins   Review of Systems As per HPI   Physical Exam Triage Vital Signs ED Triage Vitals  Enc Vitals Group     BP 10/23/19 1817 134/82     Pulse Rate 10/23/19 1817 69     Resp 10/23/19 1817 16     Temp 10/23/19 1817 98.4 F (36.9 C)     Temp Source 10/23/19 1817 Oral     SpO2 10/23/19 1817 97 %     Weight --      Height --      Head Circumference --      Peak Flow --      Pain Score 10/23/19 1826 0     Pain Loc --      Pain Edu? --      Excl. in [place]? --    No data found.  Updated Vital Signs BP 123/82   Pulse 69   Temp 98.4 F (36.9 C) (Oral)   Resp 16   SpO2 97%   Visual Acuity Right Eye Distance:   Left Eye Distance:   Bilateral Distance:    Right Eye Near:   Left Eye Near:    Bilateral Near:     Physical Exam Constitutional:      General: He is not in acute distress.    Appearance: He is not toxic-appearing or diaphoretic.  HENT:     Head: Normocephalic and atraumatic.     Mouth/Throat:     Mouth: Mucous membranes are moist.     Pharynx: Oropharynx is clear.  Eyes:     General: No scleral icterus.    Conjunctiva/sclera: Conjunctivae normal.     Pupils: Pupils are equal, round, and reactive to light.  Neck:     Comments: Trachea midline, negative JVD Cardiovascular:     Rate and Rhythm: Normal rate and regular rhythm.  Pulmonary:     Effort: Pulmonary effort is normal. No respiratory distress.     Breath sounds: No wheezing.  Musculoskeletal:     Cervical back: Neck supple. No tenderness.   Lymphadenopathy:     Cervical: No cervical adenopathy.  Skin:    Capillary Refill: Capillary refill takes less than 2 seconds.     Coloration: Skin is not jaundiced or pale.     Findings: No rash.  Neurological:     Mental Status: He is alert and oriented to person, place, and time.      UC Treatments / Results  Labs (all labs ordered are listed, but only abnormal results are displayed) Labs Reviewed - No data to display  EKG   Radiology No results found.  Procedures Procedures (including critical care time)  Medications Ordered in UC Medications - No data to display  Initial Impression / Assessment and Plan / UC Course  I have reviewed the triage vital signs and the nursing notes.  Pertinent labs & imaging results that were available during my care of the patient were reviewed by me and considered in my medical decision making (see chart for details).     BP mildly elevated in office today, normal upon discharge.  No signs/symptoms of endorgan damage as mentioned in HPI.  No formal diagnosis of hypertension.  Stressed importance of keeping log, bring blood pressure cuff with him to PCP, and keeping scheduled appointments for further evaluation/management as needed.  Return precautions discussed, pt verbalized understanding and is agreeable to plan. Final Clinical Impressions(s) / UC Diagnoses   Final diagnoses:  Elevated blood-pressure reading without diagnosis of hypertension     Discharge Instructions     Important to keep a log of your blood pressure readings. Sit for 5 minutes with feet flat on the floor.  Record numbers once daily, and follow-up with PCP for further evaluation if needed. Go to the ER if you develop elevated blood pressure readings (above 180/110) with severe headache, change in vision or hearing, dizziness, chest pain, shortness of breath.    ED Prescriptions    None     PDMP not reviewed this encounter.   Odette Fraction [place], [place]  10/23/19 1936

## 2019-10-23 NOTE — Discharge Instructions (Addendum)
Important to keep a log of your blood pressure readings. Sit for 5 minutes with feet flat on the floor.  Record numbers once daily, and follow-up with PCP for further evaluation if needed. Go to the ER if you develop elevated blood pressure readings (above 180/110) with severe headache, change in vision or hearing, dizziness, chest pain, shortness of breath. 

## 2019-10-23 NOTE — ED Triage Notes (Signed)
Pt presents with concern for high blood pressure. Pt was seen in the ER and had a high blood pressure then. Reports he got a wrist monitor and has had elevated blood pressures of 143/109. Pt denies headaches, blurry vision, or dizziness.

## 2019-10-23 NOTE — Discharge Instructions (Signed)
n

## 2020-08-11 ENCOUNTER — Other Ambulatory Visit (HOSPITAL_COMMUNITY): Payer: Self-pay | Admitting: Radiology

## 2020-08-11 DIAGNOSIS — R0600 Dyspnea, unspecified: Secondary | ICD-10-CM

## 2020-08-20 ENCOUNTER — Ambulatory Visit: Payer: Self-pay | Admitting: Emergency Medicine

## 2020-08-21 ENCOUNTER — Inpatient Hospital Stay (HOSPITAL_COMMUNITY): Admission: RE | Admit: 2020-08-21 | Payer: Self-pay | Source: Ambulatory Visit

## 2020-08-28 ENCOUNTER — Encounter (HOSPITAL_COMMUNITY): Payer: Self-pay

## 2020-09-10 ENCOUNTER — Other Ambulatory Visit: Payer: Self-pay | Admitting: Internal Medicine

## 2020-09-10 LAB — SARS CORONAVIRUS 2 (TAT 6-24 HRS): SARS Coronavirus 2: NEGATIVE

## 2020-09-11 ENCOUNTER — Ambulatory Visit (HOSPITAL_COMMUNITY)
Admission: RE | Admit: 2020-09-11 | Discharge: 2020-09-11 | Disposition: A | Discharge status: Home / Self Care | Payer: 59 | Source: Ambulatory Visit | Attending: Internal Medicine | Admitting: Internal Medicine

## 2020-09-11 ENCOUNTER — Other Ambulatory Visit: Payer: Self-pay

## 2020-09-11 DIAGNOSIS — R0600 Dyspnea, unspecified: Secondary | ICD-10-CM | POA: Diagnosis present

## 2020-09-11 LAB — PULMONARY FUNCTION TEST
DL/VA % pred: 120 %
DL/VA: 5.52 ml/min/mmHg/L
DLCO unc % pred: 92 %
DLCO unc: 30.47 ml/min/mmHg
FEF 25-75 Post: 3.79 L/sec
FEF 25-75 Pre: 2.22 L/sec
FEF2575-%Change-Post: 71 %
FEF2575-%Pred-Post: 97 %
FEF2575-%Pred-Pre: 56 %
FEV1-%Change-Post: 23 %
FEV1-%Pred-Post: 104 %
FEV1-%Pred-Pre: 84 %
FEV1-Post: 4 L
FEV1-Pre: 3.23 L
FEV1FVC-%Change-Post: 12 %
FEV1FVC-%Pred-Pre: 81 %
FEV6-%Change-Post: 11 %
FEV6-%Pred-Post: 115 %
FEV6-%Pred-Pre: 103 %
FEV6-Post: 5.27 L
FEV6-Pre: 4.73 L
FEV6FVC-%Change-Post: 1 %
FEV6FVC-%Pred-Post: 101 %
FEV6FVC-%Pred-Pre: 99 %
FVC-%Change-Post: 9 %
FVC-%Pred-Post: 113 %
FVC-%Pred-Pre: 103 %
FVC-Post: 5.31 L
FVC-Pre: 4.84 L
Post FEV1/FVC ratio: 75 %
Post FEV6/FVC ratio: 99 %
Pre FEV1/FVC ratio: 67 %
Pre FEV6/FVC Ratio: 98 %
RV % pred: 138 %
RV: 2.71 L
TLC % pred: 98 %
TLC: 7.22 L

## 2020-09-11 MED ORDER — ALBUTEROL SULFATE (2.5 MG/3ML) 0.083% IN NEBU
2.5000 mg | INHALATION_SOLUTION | Freq: Once | RESPIRATORY_TRACT | Status: AC
  Administered 2020-09-11: 2.5 mg via RESPIRATORY_TRACT

## 2020-10-13 ENCOUNTER — Other Ambulatory Visit: Payer: Self-pay | Admitting: Family Medicine

## 2020-10-13 DIAGNOSIS — K4030 Unilateral inguinal hernia, with obstruction, without gangrene, not specified as recurrent: Secondary | ICD-10-CM

## 2020-11-13 ENCOUNTER — Ambulatory Visit: Payer: 59 | Admitting: General Surgery

## 2021-11-20 ENCOUNTER — Ambulatory Visit
Admission: EM | Admit: 2021-11-20 | Discharge: 2021-11-20 | Disposition: A | Discharge status: Home / Self Care | Payer: Commercial Managed Care - HMO | Attending: Internal Medicine | Admitting: Internal Medicine

## 2021-11-20 ENCOUNTER — Encounter: Payer: Self-pay | Admitting: Emergency Medicine

## 2021-11-20 DIAGNOSIS — R369 Urethral discharge, unspecified: Secondary | ICD-10-CM

## 2021-11-20 DIAGNOSIS — Z113 Encounter for screening for infections with a predominantly sexual mode of transmission: Secondary | ICD-10-CM

## 2021-11-20 NOTE — Discharge Instructions (Addendum)
Your STD test is pending.  We will call if it is positive and treat as appropriate if necessary.  Please refrain from sexual activity until test results and treatment are complete.  You may follow-up with urology at provided contact information since this has occurred before and you have had a negative test in the past.

## 2021-11-20 NOTE — ED Provider Notes (Signed)
[facility]    CSN: [id number] Arrival date & time: 11/20/21  1100      History   Chief Complaint Chief Complaint  Patient presents with   Penile Discharge    HPI Paul Cobb is a 41 y.o. male.   Patient presents with clear penile discharge that has been present for about a week.  Denies dysuria, urinary frequency, testicular pain, abdominal pain, back pain, fever.  Patient reports unprotected sexual intercourse prior to symptoms starting.  Denies any confirmed exposure to STD.  Patient reports that this has occurred in the past but STD tests were negative at that time.   Penile Discharge    Past Medical History:  Diagnosis Date   Hernia, inguinal, left    Intestinal polyps    reports on gallbladder    There are no problems to display for this patient.   History reviewed. No pertinent surgical history.     Home Medications    Prior to Admission medications   Medication Sig Start Date End Date Taking? Authorizing Provider  famotidine (PEPCID) 20 MG tablet Take 1 tablet (20 mg total) by mouth 2 (two) times daily. 07/26/11 07/25/12  Virgel Manifold, MD  HYDROcodone-acetaminophen (VICODIN) 5-500 MG per tablet Take 0.5 tablets by mouth every 6 (six) hours as needed. For pain    [provider]  ibuprofen (ADVIL,MOTRIN) 800 MG tablet Take 1 tablet (800 mg total) by mouth every 8 (eight) hours as needed. 02/26/18   Ward, Ozella Almond, PA-C  ondansetron (ZOFRAN ODT) 4 MG disintegrating tablet Take 1 tablet (4 mg total) by mouth every 8 (eight) hours as needed for nausea or vomiting. 02/26/18   Ward, Ozella Almond, PA-C  oseltamivir (TAMIFLU) 75 MG capsule Take 1 capsule (75 mg total) by mouth every 12 (twelve) hours. 02/26/18   Ward, Ozella Almond, PA-C  oxyCODONE-acetaminophen (PERCOCET) 5-325 MG per tablet Take 0.5 tablets by mouth every 4 (four) hours as needed. For pain    [provider]  Simethicone (GAS FREE EXTRA STRENGTH) 125 MG CAPS  Take 2 capsules by mouth every 6 (six) hours as needed. For bloating    [provider]    Family History Family History  Problem Relation Age of Onset   Thyroid disease Mother    Hypertension Father    Diabetes Father     Social History Social History   Tobacco Use   Smoking status: Every Day    Packs/day: 1.50    Years: 15.00    Total pack years: 22.50    Types: Cigarettes   Smokeless tobacco: Never  Substance Use Topics   Alcohol use: Yes    Comment: occasionally     Allergies   Penicillins   Review of Systems Review of Systems Per HPI  Physical Exam Triage Vital Signs ED Triage Vitals [11/20/21 1111]  Enc Vitals Group     BP (!) 149/96     Pulse Rate 92     Resp 18     Temp 97.6 F (36.4 C)     Temp src      SpO2 97 %     Weight      Height      Head Circumference      Peak Flow      Pain Score 0     Pain Loc      Pain Edu?      Excl. in [place]?    No data found.  Updated Vital Signs  BP (!) 149/96   Pulse 92   Temp 97.6 F (36.4 C)   Resp 18   SpO2 97%   Visual Acuity Right Eye Distance:   Left Eye Distance:   Bilateral Distance:    Right Eye Near:   Left Eye Near:    Bilateral Near:     Physical Exam Constitutional:      General: He is not in acute distress.    Appearance: Normal appearance. He is not toxic-appearing or diaphoretic.  HENT:     Head: Normocephalic and atraumatic.  Eyes:     Extraocular Movements: Extraocular movements intact.     Conjunctiva/sclera: Conjunctivae normal.  Pulmonary:     Effort: Pulmonary effort is normal.  Genitourinary:    Comments: Deferred with shared decision making. Self swab performed.  Neurological:     General: No focal deficit present.     Mental Status: He is alert and oriented to person, place, and time. Mental status is at baseline.  Psychiatric:        Mood and Affect: Mood normal.        Behavior: Behavior normal.        Thought Content: Thought content normal.         Judgment: Judgment normal.      UC Treatments / Results  Labs (all labs ordered are listed, but only abnormal results are displayed) Labs Reviewed  CYTOLOGY, (ORAL, ANAL, URETHRAL) ANCILLARY ONLY    EKG   Radiology No results found.  Procedures Procedures (including critical care time)  Medications Ordered in UC Medications - No data to display  Initial Impression / Assessment and Plan / UC Course  I have reviewed the triage vital signs and the nursing notes.  Pertinent labs & imaging results that were available during my care of the patient were reviewed by me and considered in my medical decision making (see chart for details).     Highly suspicious of STD.  Cytology swab pending.  Will await results for further treatment. UA deferred given no associated urinary symptoms.  Advised patient to refrain from sexual activity until test results and treatment are complete.  Given this has occurred before and STD tests were negative, advised patient to follow-up with urology at provided contact information for further evaluation and management especially if symptoms persist or worsen.  Discussed return precautions.  Patient verbalized understanding and was agreeable with plan. Final Clinical Impressions(s) / UC Diagnoses   Final diagnoses:  Penile discharge  Screening examination for venereal disease     Discharge Instructions      Your STD test is pending.  We will call if it is positive and treat as appropriate if necessary.  Please refrain from sexual activity until test results and treatment are complete.  You may follow-up with urology at provided contact information since this has occurred before and you have had a negative test in the past.    ED Prescriptions   None    PDMP not reviewed this encounter.   Gustavus Bryant, [place] 11/20/21 1204

## 2021-11-20 NOTE — ED Triage Notes (Signed)
Pt is present today with c/o penile clear discharge x1 week

## 2022-01-16 ENCOUNTER — Ambulatory Visit (HOSPITAL_COMMUNITY)
Admission: EM | Admit: 2022-01-16 | Discharge: 2022-01-16 | Disposition: A | Discharge status: Home / Self Care | Payer: Commercial Managed Care - HMO

## 2023-05-31 ENCOUNTER — Other Ambulatory Visit: Payer: Self-pay | Admitting: Medical Oncology

## 2023-05-31 DIAGNOSIS — D45 Polycythemia vera: Secondary | ICD-10-CM

## 2023-06-01 ENCOUNTER — Other Ambulatory Visit: Payer: Self-pay | Admitting: Medical Oncology

## 2023-06-01 ENCOUNTER — Inpatient Hospital Stay

## 2023-06-01 ENCOUNTER — Inpatient Hospital Stay: Attending: Internal Medicine | Admitting: Internal Medicine

## 2023-06-01 DIAGNOSIS — D45 Polycythemia vera: Secondary | ICD-10-CM

## 2023-06-07 ENCOUNTER — Telehealth: Payer: Self-pay | Admitting: Physician Assistant

## 2023-06-21 ENCOUNTER — Other Ambulatory Visit

## 2023-06-21 ENCOUNTER — Encounter: Admitting: Physician Assistant

## 2023-07-11 NOTE — Progress Notes (Deleted)
 [facility] [phone]   [phone]  CONSULT NOTE  REFERRING PHYSICIAN: Rogelia Ricker PA-C  REASON FOR CONSULTATION:  polycythemia  HPI Paul Cobb is a 43 y.o. male with a past medical history for hypercholesterolemia, Asthma, vitamin D deficiency, hypothyroidism, and erectile dysfunction is referred to the clinic for polycythemia  The patient saw his PCP on 04/25/2023 for a follow-up for erectile dysfunction.  They did a hormone workup at that time.  He had blood work performed that showed polycythemia with a hemoglobin of 17.7 which is drawn 1 month prior to his appointment on 03/25/2023.  His white blood cell count was normal at 5.0.  His platelet count was normal at 263.  He is referred to the clinic for the polycythemia.  To her knowledge, she had elevated Hbg for ***   The oldest labs available are from 2013. I do not see any prior polycythemia.   Overall she is feeling fairly well except ***.  He denies known sleep apnea.    He denies any hormones or steroid use.  He denies any excessive vitamin intake.  He denies any frequent headaches. Regarding heart disease, he denies ***.   He denies early satiety.  He denies any unexplained weight loss.  ***Smoking. Itching after a hot shower*** He denies any hematuria, abdominal, back pain, or unusual pelvic pain.  He denies any asthma or COPD??? Asthma? Steriods? Inhaler?.  He denies any recent dehydration with nausea, vomiting, or diarrhea. He does not take lasix.    HPI  Past Medical History:  Diagnosis Date   Hernia, inguinal, left    Intestinal polyps    reports on gallbladder    No past surgical history on file.  Family History  Problem Relation Age of Onset   Thyroid disease Mother    Hypertension Father    Diabetes Father     Social History Social History   Tobacco Use   Smoking status: Every Day    Current packs/day: 1.50    Average packs/day: 1.5 packs/day for 15.0  years (22.5 ttl pk-yrs)    Types: Cigarettes   Smokeless tobacco: Never  Substance Use Topics   Alcohol use: Yes    Comment: occasionally    Allergies  Allergen Reactions   Penicillins     Childhood allergy     Current Outpatient Medications  Medication Sig Dispense Refill   famotidine  (PEPCID ) 20 MG tablet Take 1 tablet (20 mg total) by mouth 2 (two) times daily. 60 tablet 0   HYDROcodone-acetaminophen (VICODIN) 5-500 MG per tablet Take 0.5 tablets by mouth every 6 (six) hours as needed. For pain     ibuprofen  (ADVIL ,MOTRIN ) 800 MG tablet Take 1 tablet (800 mg total) by mouth every 8 (eight) hours as needed. 21 tablet 0   ondansetron  (ZOFRAN  ODT) 4 MG disintegrating tablet Take 1 tablet (4 mg total) by mouth every 8 (eight) hours as needed for nausea or vomiting. 10 tablet 0   oseltamivir  (TAMIFLU ) 75 MG capsule Take 1 capsule (75 mg total) by mouth every 12 (twelve) hours. 10 capsule 0   oxyCODONE-acetaminophen (PERCOCET) 5-325 MG per tablet Take 0.5 tablets by mouth every 4 (four) hours as needed. For pain     Simethicone  (GAS FREE EXTRA STRENGTH) 125 MG CAPS Take 2 capsules by mouth every 6 (six) hours as needed. For bloating     No current facility-administered medications for this visit.    REVIEW OF SYSTEMS:   Review  of Systems  Constitutional: Negative for appetite change, chills, fatigue, fever and unexpected weight change.  HENT:   Negative for mouth sores, nosebleeds, sore throat and trouble swallowing.   Eyes: Negative for eye problems and icterus.  Respiratory: Negative for cough, hemoptysis, shortness of breath and wheezing.   Cardiovascular: Negative for chest pain and leg swelling.  Gastrointestinal: Negative for abdominal pain, constipation, diarrhea, nausea and vomiting.  Genitourinary: Negative for bladder incontinence, difficulty urinating, dysuria, frequency and hematuria.   Musculoskeletal: Negative for back pain, gait problem, neck pain and neck stiffness.   Skin: Negative for itching and rash.  Neurological: Negative for dizziness, extremity weakness, gait problem, headaches, light-headedness and seizures.  Hematological: Negative for adenopathy. Does not bruise/bleed easily.  Psychiatric/Behavioral: Negative for confusion, depression and sleep disturbance. The patient is not nervous/anxious.     PHYSICAL EXAMINATION:  There were no vitals taken for this visit.  ECOG PERFORMANCE STATUS: {CHL ONC ECOG [id number]  Physical Exam  Constitutional: Oriented to person, place, and time and well-developed, well-nourished, and in no distress. No distress.  HENT:  Head: Normocephalic and atraumatic.  Mouth/Throat: Oropharynx is clear and moist. No oropharyngeal exudate.  Eyes: Conjunctivae are normal. Right eye exhibits no discharge. Left eye exhibits no discharge. No scleral icterus.  Neck: Normal range of motion. Neck supple.  Cardiovascular: Normal rate, regular rhythm, normal heart sounds and intact distal pulses.   Pulmonary/Chest: Effort normal and breath sounds normal. No respiratory distress. No wheezes. No rales.  Abdominal: Soft. Bowel sounds are normal. Exhibits no distension and no mass. There is no tenderness.  Musculoskeletal: Normal range of motion. Exhibits no edema.  Lymphadenopathy:    No cervical adenopathy.  Neurological: Alert and oriented to person, place, and time. Exhibits normal muscle tone. Gait normal. Coordination normal.  Skin: Skin is warm and dry. No rash noted. Not diaphoretic. No erythema. No pallor.  Psychiatric: Mood, memory and judgment normal.  Vitals reviewed.  LABORATORY DATA: Lab Results  Component Value Date   WBC 6.2 09/13/2019   HGB 15.7 09/13/2019   HCT 45.1 09/13/2019   MCV 87.6 09/13/2019   PLT 217 09/13/2019      Chemistry      Component Value Date/Time   NA 142 09/13/2019 2016   K 3.7 09/13/2019 2016   CL 106 09/13/2019 2016   CO2 26 09/13/2019 2016   BUN 10 09/13/2019 2016    CREATININE 0.97 09/13/2019 2016      Component Value Date/Time   CALCIUM 9.7 09/13/2019 2016   ALKPHOS 55 09/13/2019 2016   AST 21 09/13/2019 2016   ALT 30 09/13/2019 2016   BILITOT 0.5 09/13/2019 2016       RADIOGRAPHIC STUDIES: No results found.  ASSESSMENT: This is a very pleasant 43 year old African-American male referred to the clinic for polycythemia.  He had several lab studies performed today including a CBC, CMP, EPO, iron studies, ferritin, and JAK2 mutation testing.  We will see him back in 1 month to review the results.  Today his hemoglobin is ***  The patient was advised to avoid excessive vitamin intake.  If this is reactive polycythemia, then no need to undergo phlebotomy.  Would recommend avoiding excessive iron intake and the patient can consider ***.  He may want to consider ***aspirin.  However if this is polycythemia vera, then we would recommend phlebotomies in order to keep the hematocrit around 45% or less.  The patient voices understanding of current disease status and treatment options and is  in agreement with the current care plan.  All questions were answered. The patient knows to call the clinic with any problems, questions or concerns. We can certainly see the patient much sooner if necessary.  Thank you so much for allowing me to participate in the care of [facility]. I will continue to follow up the patient with you and assist in his care.  I spent {CHL ONC TIME VISIT - DTPQU:[id number]} counseling the patient face to face. The total time spent in the appointment was {CHL ONC TIME VISIT - DTPQU:[id number]}.  Disclaimer: This note was dictated with voice recognition software. Similar sounding words can inadvertently be transcribed and may not be corrected upon review.   Mayumi Summerson L Shernita Rabinovich July 11, 2023, 12:21 PM

## 2023-07-12 ENCOUNTER — Inpatient Hospital Stay: Attending: Internal Medicine

## 2023-07-12 ENCOUNTER — Inpatient Hospital Stay: Admitting: Physician Assistant

## 2023-07-12 ENCOUNTER — Other Ambulatory Visit: Payer: Self-pay | Admitting: Physician Assistant

## 2023-07-12 DIAGNOSIS — D45 Polycythemia vera: Secondary | ICD-10-CM

## 2023-07-14 ENCOUNTER — Telehealth: Payer: Self-pay | Admitting: Physician Assistant

## 2023-07-14 NOTE — Telephone Encounter (Signed)
 I attempted to re-schedule Paul Cobb for his cancelled appointment.
